# Patient Record
Sex: Male | Born: 1995 | Race: Black or African American | Hispanic: No | Marital: Single | Smoking: Never smoker
Health system: Southern US, Community
[De-identification: ages and names within clinical notes are randomized; demographics above are authoritative.]

---

## 2020-01-08 ENCOUNTER — Emergency Department (HOSPITAL_COMMUNITY)
Admission: EM | Admit: 2020-01-08 | Discharge: 2020-01-08 | Disposition: A | Discharge status: Home / Self Care | Payer: Self-pay | Attending: Emergency Medicine | Admitting: Emergency Medicine

## 2020-01-08 ENCOUNTER — Encounter (HOSPITAL_COMMUNITY): Payer: Self-pay | Admitting: Emergency Medicine

## 2020-01-08 ENCOUNTER — Other Ambulatory Visit: Payer: Self-pay

## 2020-01-08 ENCOUNTER — Ambulatory Visit (HOSPITAL_COMMUNITY): Admission: EM | Admit: 2020-01-08 | Discharge: 2020-01-08 | Discharge status: Against Medical Advice | Payer: Self-pay

## 2020-01-08 ENCOUNTER — Emergency Department (HOSPITAL_COMMUNITY): Payer: Self-pay

## 2020-01-08 DIAGNOSIS — Z5321 Procedure and treatment not carried out due to patient leaving prior to being seen by health care provider: Secondary | ICD-10-CM | POA: Insufficient documentation

## 2020-01-08 DIAGNOSIS — K6289 Other specified diseases of anus and rectum: Secondary | ICD-10-CM | POA: Insufficient documentation

## 2020-01-08 DIAGNOSIS — M5440 Lumbago with sciatica, unspecified side: Secondary | ICD-10-CM | POA: Insufficient documentation

## 2020-01-08 MED ORDER — METHOCARBAMOL 500 MG PO TABS: 500.0000 mg | ORAL_TABLET | Freq: Three times a day (TID) | ORAL | 0 refills | Status: AC

## 2020-01-08 MED ORDER — PREDNISONE 10 MG PO TABS: 40.0000 mg | ORAL_TABLET | Freq: Every day | ORAL | 0 refills | Status: AC

## 2020-01-08 MED ORDER — HYDROCODONE-ACETAMINOPHEN 5-325 MG PO TABS
2.0000 | ORAL_TABLET | Freq: Once | ORAL | Status: AC
  Administered 2020-01-08: 2 via ORAL
  Filled 2020-01-08: qty 2

## 2020-01-08 MED ORDER — KETOROLAC TROMETHAMINE 15 MG/ML IJ SOLN
15.0000 mg | Freq: Once | INTRAMUSCULAR | Status: AC
  Administered 2020-01-08: 15 mg via INTRAMUSCULAR
  Filled 2020-01-08: qty 1

## 2020-01-08 MED ORDER — PREDNISONE 20 MG PO TABS
40.0000 mg | ORAL_TABLET | Freq: Once | ORAL | Status: AC
  Administered 2020-01-08: 40 mg via ORAL
  Filled 2020-01-08: qty 2

## 2020-01-08 NOTE — ED Provider Notes (Addendum)
[facility] EMERGENCY DEPARTMENT Provider Note   CSN: [id number] Arrival date & time: 01/08/20  0160     History Chief Complaint  Patient presents with  . Rectal Pain    Charles Edwards is a 24 y.o. male otherwise healthy no daily medication use presents today for back pain and rectal pain.  Patient reports that he was lifting boxes at his job on Monday, 01/05/2020.  He reports that during that time he developed lower back pain he describes as severe constant aching sharp throb that radiates down to his rectum, no clear alleviating factors gradually improving over the last few days, pain worsened with movement and palpation.  He reports that his lower back pain has gradually resolved but pain around his rectum has persisted.  He denies any fever/chills, IV drug use, injury/trauma, penetrations, drainage/swelling, hematochezia, change to bowel habits, bowel/bladder incontinence, urinary retention, saddle area paresthesias, numbness/tingling, weakness, abdominal pain, testicular pain/swelling, dysuria/hematuria or any additional concerns.  HPI     No past medical history on file.  There are no problems to display for this patient.   No past surgical history on file.     No family history on file.  Social History   Tobacco Use  . Smoking status: Never Smoker  . Smokeless tobacco: Never Used  Substance Use Topics  . Alcohol use: Never  . Drug use: Yes    Types: Marijuana    Home Medications Prior to Admission medications   Medication Sig Start Date End Date Taking? Authorizing Provider  methocarbamol (ROBAXIN) 500 MG tablet Take 1 tablet (500 mg total) by mouth 3 (three) times daily. 01/08/20   Harlene Salts A, PA-C  predniSONE (DELTASONE) 10 MG tablet Take 4 tablets (40 mg total) by mouth daily for 4 days. 01/09/20 01/13/20  Bill Salinas, PA-C    Allergies    Patient has no known allergies.  Review of Systems   Review of Systems   Constitutional: Negative.  Negative for chills and fever.  Cardiovascular: Negative.  Negative for chest pain.  Gastrointestinal: Positive for rectal pain. Negative for abdominal pain, anal bleeding, blood in stool, constipation, diarrhea, nausea and vomiting.  Genitourinary: Negative.  Negative for difficulty urinating, discharge, dysuria, frequency, scrotal swelling and testicular pain.  Musculoskeletal: Positive for back pain. Negative for neck pain.  Neurological: Negative for weakness and numbness.       Denies saddle paresthesias Denies bowel/bladder incontinence Denies urinary retention    Physical Exam Updated Vital Signs BP 122/71   Pulse 67   Temp 97.9 F (36.6 C) (Oral)   Resp 14   Ht 6' (1.829 m)   Wt 115.7 kg   SpO2 100%   BMI 34.58 kg/m   Physical Exam Constitutional:      General: He is not in acute distress.    Appearance: Normal appearance. He is well-developed. He is not ill-appearing or diaphoretic.  HENT:     Head: Normocephalic and atraumatic.     Right Ear: External ear normal.     Left Ear: External ear normal.     Nose: Nose normal.  Eyes:     General: Vision grossly intact. Gaze aligned appropriately.     Pupils: Pupils are equal, round, and reactive to light.  Neck:     Trachea: Trachea and phonation normal. No tracheal deviation.  Pulmonary:     Effort: Pulmonary effort is normal. No respiratory distress.  Abdominal:     General: There is no distension.  Palpations: Abdomen is soft.     Tenderness: There is no abdominal tenderness. There is no guarding or rebound.  Genitourinary:    Rectum: No mass, anal fissure, external hemorrhoid or internal hemorrhoid. Normal anal tone.     Comments: Examination chaperoned by Wyline Copas. Musculoskeletal:        General: Normal range of motion.     Cervical back: Normal range of motion.     Comments: No midline C/T/L spinal tenderness to palpation, left greater than right lumbar paraspinal muscle  tenderness, no deformity, crepitus, or step-off noted. No sign of injury to the neck or back.  Skin:    General: Skin is warm and dry.  Neurological:     Mental Status: He is alert.     GCS: GCS eye subscore is 4. GCS verbal subscore is 5. GCS motor subscore is 6.     Comments: Speech is clear and goal oriented, follows commands Major Cranial nerves without deficit, no facial droop Normal strength in upper and lower extremities bilaterally including dorsiflexion and plantar flexion, strong and equal grip strength Sensation normal to light and sharp touch Moves extremities without ataxia, coordination intact DTR 2+ bilateral patella, no clonus of the feet  Psychiatric:        Behavior: Behavior normal.     ED Results / Procedures / Treatments   Labs (all labs ordered are listed, but only abnormal results are displayed) Labs Reviewed - No data to display  EKG None  Radiology DG Lumbar Spine Complete  Result Date: 01/08/2020 CLINICAL DATA:  Low back pain EXAM: LUMBAR SPINE - COMPLETE 4+ VIEW COMPARISON:  None. FINDINGS: Five lumbar type vertebral bodies are well visualized. Vertebral body height is well maintained. No pars defects are seen. No anterolisthesis is noted. No soft tissue abnormality is seen. IMPRESSION: No acute abnormality noted Electronically Signed   By: Alcide Clever M.D.   On: 01/08/2020 12:36    Procedures Procedures (including critical care time)  Medications Ordered in ED Medications  predniSONE (DELTASONE) tablet 40 mg (has no administration in time range)  ketorolac (TORADOL) 15 MG/ML injection 15 mg (has no administration in time range)  HYDROcodone-acetaminophen (NORCO/VICODIN) 5-325 MG per tablet 2 tablet (has no administration in time range)    ED Course  I have reviewed the triage vital signs and the nursing notes.  Pertinent labs & imaging results that were available during my care of the patient were reviewed by me and considered in my medical  decision making (see chart for details).    MDM Rules/Calculators/A&P                     24 year old otherwise healthy male presents today for low back pain and rectal pain onset 3 days ago after lifting a box at work.  He is well-appearing no acute distress.  He denies any cauda equina type symptoms and has no risk factors concerning for spinal epidural abscess or AAA.  Additionally history and consistent with kidney stone or other intra-abdominal pathologies at this time.  Rectal examination is unremarkable, normal rectal tone, no visible or palpable hemorrhoids fissures or masses.  Stool soft and light brown without evidence of blood.  High suspicion for low back pain with sciatica at this time will obtain lumbar spine x-ray and reassess. Discussed plan with Dr. Particia Nearing. - DG Lumbar spine:  IMPRESSION:  No acute abnormality noted  I have ordered reviewed and interpreted this x-ray, agree with radiologist interpretation of  no acute abnormalities. ----- Patient reassessed sitting in a stool in the corner of the room no acute distress talking with his family member.  We will treat with 2 pills Norco here in the ED, family member will drive home.  Additionally will start patient on prednisone burst for suspected sciatica symptoms, he has no history of diabetes or adverse reaction to steroids.  Additionally will give 15 mg Toradol for treatment of pain, he denies any history of gastric ulcers or kidney disease.  Finally will prescribe Robaxin 500 mg 3 times daily for suspected muscular etiology of patient's symptoms today, he is aware of muscle relaxer precautions and has no questions.  Work note given.  Referral to orthopedist given for follow-up.  As above suspect patient symptoms secondary to sciatica, no evidence of hemorrhoids, hernia, fissures, kidney stone, cauda equina, AAA, spinal epidural abscess, stone disease or other emergent pathologies at this time.  Patient is agreeable to discharge  with above treatment and has no further questions.  At this time there does not appear to be any evidence of an acute emergency medical condition and the patient appears stable for discharge with appropriate outpatient follow up. Diagnosis was discussed with patient who verbalizes understanding of care plan and is agreeable to discharge. I have discussed return precautions with patient and *family who verbalizes understanding of return precautions. Patient encouraged to follow-up with their PCP and ortho. All questions answered.  Patient's case discussed with Dr. Gilford Raid who agrees with plan to discharge with orthopedic follow-up.  ---- 1:43 PM: At discharge patient running down the hallway laughing with his family member no acute distress.  Normal gait.   Note: Portions of this report may have been transcribed using voice recognition software. Every effort was made to ensure accuracy; however, inadvertent computerized transcription errors may still be present. Final Clinical Impression(s) / ED Diagnoses Final diagnoses:  Acute left-sided low back pain with sciatica, sciatica laterality unspecified    Rx / DC Orders ED Discharge Orders         Ordered    predniSONE (DELTASONE) 10 MG tablet  Daily     01/08/20 1308    methocarbamol (ROBAXIN) 500 MG tablet  3 times daily     01/08/20 1308           Deliah Boston, PA-C 01/08/20 1322    Gari Crown 01/08/20 1349    Isla Pence, MD 01/08/20 1414

## 2020-01-08 NOTE — Discharge Instructions (Signed)
You have been diagnosed today with Low back pain with sciatica.  At this time there does not appear to be the presence of an emergent medical condition, however there is always the potential for conditions to change. Please read and follow the below instructions.  Please return to the Emergency Department immediately for any new or worsening symptoms. Please be sure to follow up with your Primary Care Provider within one week regarding your visit today; please call their office to schedule an appointment even if you are feeling better for a follow-up visit. You may use the muscle relaxer Robaxin as prescribed to help with your symptoms.  Do not drive or operate heavy machinery while taking Robaxin as it will make you drowsy.  Do not drink alcohol or take other sedating medications while taking Robaxin as this will worsen side effects. You have been given an NSAID-containing medication called Toradol today.  Do not take the medications including ibuprofen, Aleve, Advil, naproxen or other NSAID-containing medications for the next 2 days.  Please be sure to drink plenty of water over the next few days. You are given a pain medication today called Norco.  This medication will make you drowsy do not drive or perform any dance activities for the rest the day. You may use the medication prednisone as prescribed to help with your symptoms. Please call the orthopedic specialist Dr. Everardo Pacific on your discharge paperwork for follow-up appointment.  Get help right away if: You cannot control when you pee (urinate) or poop (have a bowel movement). You have weakness in any of these areas and it gets worse: Lower back. The area between your hip bones. Butt. Legs. You have redness or swelling of your back. You have a burning feeling when you pee. You have rectal bleeding or swelling You have any new/concerning or worsening of symptoms  Please read the additional information packets attached to your discharge  summary.  Do not take your medicine if  develop an itchy rash, swelling in your mouth or lips, or difficulty breathing; call 911 and seek immediate emergency medical attention if this occurs.  Note: Portions of this text may have been transcribed using voice recognition software. Every effort was made to ensure accuracy; however, inadvertent computerized transcription errors may still be present.

## 2020-01-08 NOTE — ED Triage Notes (Signed)
Patient states lifting boxes at work and the next day his rectum starting hurting.

## 2020-01-08 NOTE — ED Triage Notes (Signed)
Pt here with rectum pain that started hurting yesterday. Pt was seen at [facility] this morning around 440 for the same thing. Pt states he doesn't remember what they told him. Pain is a 9/10. Pt denies ever having a hemorrhoid.

## 2020-01-12 ENCOUNTER — Emergency Department (HOSPITAL_COMMUNITY): Payer: Self-pay

## 2020-01-12 ENCOUNTER — Emergency Department (HOSPITAL_COMMUNITY)
Admission: EM | Admit: 2020-01-12 | Discharge: 2020-01-12 | Disposition: A | Discharge status: Home / Self Care | Payer: Self-pay | Attending: Emergency Medicine | Admitting: Emergency Medicine

## 2020-01-12 ENCOUNTER — Encounter (HOSPITAL_COMMUNITY): Payer: Self-pay | Admitting: *Deleted

## 2020-01-12 ENCOUNTER — Other Ambulatory Visit: Payer: Self-pay

## 2020-01-12 DIAGNOSIS — K610 Anal abscess: Secondary | ICD-10-CM | POA: Insufficient documentation

## 2020-01-12 DIAGNOSIS — M899 Disorder of bone, unspecified: Secondary | ICD-10-CM | POA: Insufficient documentation

## 2020-01-12 LAB — I-STAT CHEM 8, ED
BUN: 11 mg/dL (ref 6–20)
Calcium, Ion: 1.13 mmol/L — ABNORMAL LOW (ref 1.15–1.40)
Chloride: 100 mmol/L (ref 98–111)
Creatinine, Ser: 1.1 mg/dL (ref 0.61–1.24)
Glucose, Bld: 95 mg/dL (ref 70–99)
HCT: 48 % (ref 39.0–52.0)
Hemoglobin: 16.3 g/dL (ref 13.0–17.0)
Potassium: 4 mmol/L (ref 3.5–5.1)
Sodium: 140 mmol/L (ref 135–145)
TCO2: 34 mmol/L — ABNORMAL HIGH (ref 22–32)

## 2020-01-12 MED ORDER — HYDROMORPHONE HCL 1 MG/ML IJ SOLN
1.0000 mg | Freq: Once | INTRAMUSCULAR | Status: AC
  Administered 2020-01-12: 1 mg via INTRAVENOUS
  Filled 2020-01-12: qty 1

## 2020-01-12 MED ORDER — IOHEXOL 300 MG/ML  SOLN
100.0000 mL | Freq: Once | INTRAMUSCULAR | Status: AC | PRN
  Administered 2020-01-12: 12:00:00 100 mL via INTRAVENOUS

## 2020-01-12 MED ORDER — HYDROMORPHONE HCL 1 MG/ML IJ SOLN
1.0000 mg | Freq: Once | INTRAMUSCULAR | Status: AC
  Administered 2020-01-12: 13:00:00 1 mg via INTRAVENOUS
  Filled 2020-01-12: qty 1

## 2020-01-12 MED ORDER — AMOXICILLIN-POT CLAVULANATE 875-125 MG PO TABS: 1.0000 | ORAL_TABLET | Freq: Two times a day (BID) | ORAL | 0 refills | Status: AC

## 2020-01-12 MED ORDER — LIDOCAINE-EPINEPHRINE (PF) 2 %-1:200000 IJ SOLN
10.0000 mL | Freq: Once | INTRAMUSCULAR | Status: AC
  Administered 2020-01-12: 10 mL via INTRADERMAL
  Filled 2020-01-12: qty 20

## 2020-01-12 NOTE — ED Provider Notes (Signed)
[facility] EMERGENCY DEPARTMENT Provider Note   CSN: [id number] Arrival date & time: 01/12/20  0631     History Chief Complaint  Patient presents with  . Abscess  . Back Pain    Kerrington Greenhalgh is a 24 y.o. male.  HPI 24 year old male presents with rectal pain.  Has been ongoing for about 4 or 5 days.  Patient was also endorsing back pain earlier but states he has no back pain now.  No radicular symptoms.  Only has the rectal pain and painful bowel movements.  Family has noticed what appears to be an abscess at his anus.  History reviewed. No pertinent past medical history.  There are no problems to display for this patient.   History reviewed. No pertinent surgical history.     History reviewed. No pertinent family history.  Social History   Tobacco Use  . Smoking status: Never Smoker  . Smokeless tobacco: Never Used  Substance Use Topics  . Alcohol use: Never  . Drug use: Yes    Types: Marijuana    Home Medications Prior to Admission medications   Medication Sig Start Date End Date Taking? Authorizing Provider  amoxicillin-clavulanate (AUGMENTIN) 875-125 MG tablet Take 1 tablet by mouth 2 (two) times daily. One po bid x 7 days 01/12/20   Pricilla Loveless, MD  methocarbamol (ROBAXIN) 500 MG tablet Take 1 tablet (500 mg total) by mouth 3 (three) times daily. 01/08/20   Harlene Salts A, PA-C  predniSONE (DELTASONE) 10 MG tablet Take 4 tablets (40 mg total) by mouth daily for 4 days. 01/09/20 01/13/20  Bill Salinas, PA-C    Allergies    Patient has no known allergies.  Review of Systems   Review of Systems  Gastrointestinal: Positive for rectal pain.  Musculoskeletal: Negative for back pain.  Neurological: Negative for weakness and numbness.  All other systems reviewed and are negative.   Physical Exam Updated Vital Signs BP (!) 117/92   Pulse (!) 56   Temp 98 F (36.7 C) (Oral)   Resp 16   Ht 6' (1.829 m)   Wt 115.7 kg    SpO2 100%   BMI 34.58 kg/m   Physical Exam Vitals and nursing note reviewed.  Constitutional:      Appearance: He is well-developed. He is not ill-appearing or diaphoretic.  HENT:     Head: Normocephalic and atraumatic.     Right Ear: External ear normal.     Left Ear: External ear normal.     Nose: Nose normal.  Eyes:     General:        Right eye: No discharge.        Left eye: No discharge.  Pulmonary:     Effort: Pulmonary effort is normal.  Abdominal:     Palpations: Abdomen is soft.     Tenderness: There is no abdominal tenderness.  Genitourinary:   Musculoskeletal:     Cervical back: Neck supple.     Lumbar back: No tenderness.  Skin:    General: Skin is warm and dry.  Neurological:     Mental Status: He is alert.  Psychiatric:        Mood and Affect: Mood is not anxious.     ED Results / Procedures / Treatments   Labs (all labs ordered are listed, but only abnormal results are displayed) Labs Reviewed  I-STAT CHEM 8, ED - Abnormal; Notable for the following components:      Result Value  Calcium, Ion 1.13 (*)    TCO2 34 (*)    All other components within normal limits    EKG None  Radiology CT PELVIS W CONTRAST  Result Date: 01/12/2020 CLINICAL DATA:  Back pain, rectal pain, painful bump near rectum question abscess EXAM: CT PELVIS WITH CONTRAST TECHNIQUE: Multidetector CT imaging of the pelvis was performed using the standard protocol following the bolus administration of intravenous contrast. Sagittal and coronal MPR images reconstructed from axial data set. CONTRAST:  OMNIPAQUE IOHEXOL 300 MG/ML SOLN IV. No oral contrast. COMPARISON:  None FINDINGS: Urinary Tract:  Visualized ureters and bladder normal appearance Bowel:  Normal appendix.  Large and small bowel loops normal Vascular/Lymphatic: Vascular structures patent. Aortic bifurcation normal caliber. Reproductive:  Unremarkable prostate gland and seminal vesicles Other: Tiny LEFT perianal  fluid collection identified, 2.1 x 1.5 x 2.5 cm consistent with perianal abscess. No other abnormal fluid or gas collections identified. No hernia. No free pelvic fluid or free air. Musculoskeletal: Bone island RIGHT femoral neck. Mixed lytic and sclerotic bone lesion of the intertrochanteric region of the LEFT femur 3.9 x 3.9 cm extending 5.7 cm length. IMPRESSION: Small LEFT perianal abscess measuring 2.1 x 1.5 x 2.5 cm. No acute intrapelvic abnormalities. 3.9 x 3.9 x 5.7 cm diameter mixed lytic and sclerotic bone lesion at the intertrochanteric region of the LEFT femur with generally benign features, question fibro-osseous lesion though somewhat atypical; further evaluation by MR imaging recommended to characterize. Electronically Signed   By: Ulyses Southward M.D.   On: 01/12/2020 12:14    Procedures .Marland KitchenIncision and Drainage  Date/Time: 01/12/2020 1:15 PM Performed by: Pricilla Loveless, MD Authorized by: Pricilla Loveless, MD   Consent:    Consent obtained:  Verbal   Consent given by:  Patient Location:    Type:  Abscess   Size:  2x2 cm   Location:  Anogenital   Anogenital location:  Perianal Pre-procedure details:    Skin preparation:  Betadine Anesthesia (see MAR for exact dosages):    Anesthesia method:  Local infiltration   Local anesthetic:  Lidocaine 2% WITH epi Procedure type:    Complexity:  Simple Procedure details:    Incision types:  Single straight   Incision depth:  Dermal   Scalpel blade:  11   Drainage:  Purulent   Drainage amount:  Copious   Wound treatment:  Wound left open Post-procedure details:    Patient tolerance of procedure:  Tolerated with difficulty   (including critical care time)  Medications Ordered in ED Medications  HYDROmorphone (DILAUDID) injection 1 mg (1 mg Intravenous Given 01/12/20 0907)  iohexol (OMNIPAQUE) 300 MG/ML solution 100 mL (100 mLs Intravenous Contrast Given 01/12/20 1150)  lidocaine-EPINEPHrine (XYLOCAINE W/EPI) 2 %-1:200000 (PF)  injection 10 mL (10 mLs Intradermal Given by Other 01/12/20 1240)  HYDROmorphone (DILAUDID) injection 1 mg (1 mg Intravenous Given 01/12/20 1237)    ED Course  I have reviewed the triage vital signs and the nursing notes.  Pertinent labs & imaging results that were available during my care of the patient were reviewed by me and considered in my medical decision making (see chart for details).    MDM Rules/Calculators/A&P                      Given how tender he is in limited exam, CT scan obtained to rule out deeper infection/perirectal abscess.  CT scan and labs were personally reviewed and incorporated into medical decision making.  The patient  is noted to have a perianal abscess but nothing deeper.  Thus it was incised and drained as above.  Otherwise, I discussed with him and his significant other at the bedside about the bone lesion seen.  This appears to have benign features according to radiology but will need outpatient MRI.  Refer to PCP and/or Ortho.  Will prescribe antibiotics for the abscess. Final Clinical Impression(s) / ED Diagnoses Final diagnoses:  Perianal abscess  Bone lesion    Rx / DC Orders ED Discharge Orders         Ordered    amoxicillin-clavulanate (AUGMENTIN) 875-125 MG tablet  2 times daily     01/12/20 1312           Sherwood Gambler, MD 01/12/20 1317

## 2020-01-12 NOTE — ED Notes (Signed)
Pt d/c home per MD order. Discharge summary reviewed with pt, pt verbalizes understanding. Pt gf discharge ride home.

## 2020-01-12 NOTE — ED Notes (Signed)
Pt transported to CT ?

## 2020-01-12 NOTE — Discharge Instructions (Signed)
Take the antibiotics as prescribed. Use Sitz baths to help with healing of your abscess.  Follow-up with a general surgeon as sometimes abscesses can recur in this area.  The CT scan shows a nonspecific lesion in your left femur.  You need an outpatient MRI to further evaluate this.  You should follow-up with a primary care physician and/or orthopedist to help facilitate this.

## 2020-01-12 NOTE — ED Triage Notes (Signed)
Pt was here recently for back pain and rectal pain. Reports now having a painful bump near rectum. No acute distress is noted at triage.

## 2021-09-23 IMAGING — CR DG LUMBAR SPINE COMPLETE 4+V
5 series · 5 of 5 positions shown · non-contrast
Comparison: None.

CLINICAL DATA: Low back pain

EXAM:
LUMBAR SPINE - COMPLETE 4+ VIEW

[l-spine ap]
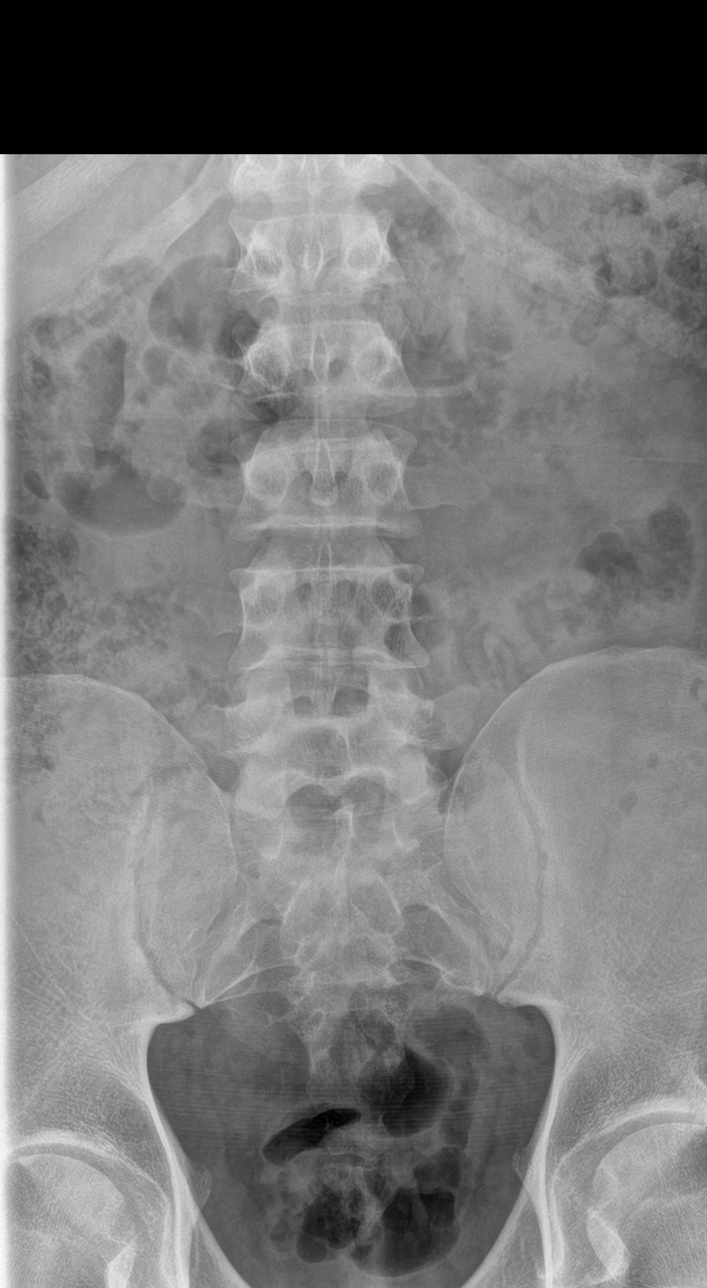

[l-spine obl (1 of 2)]
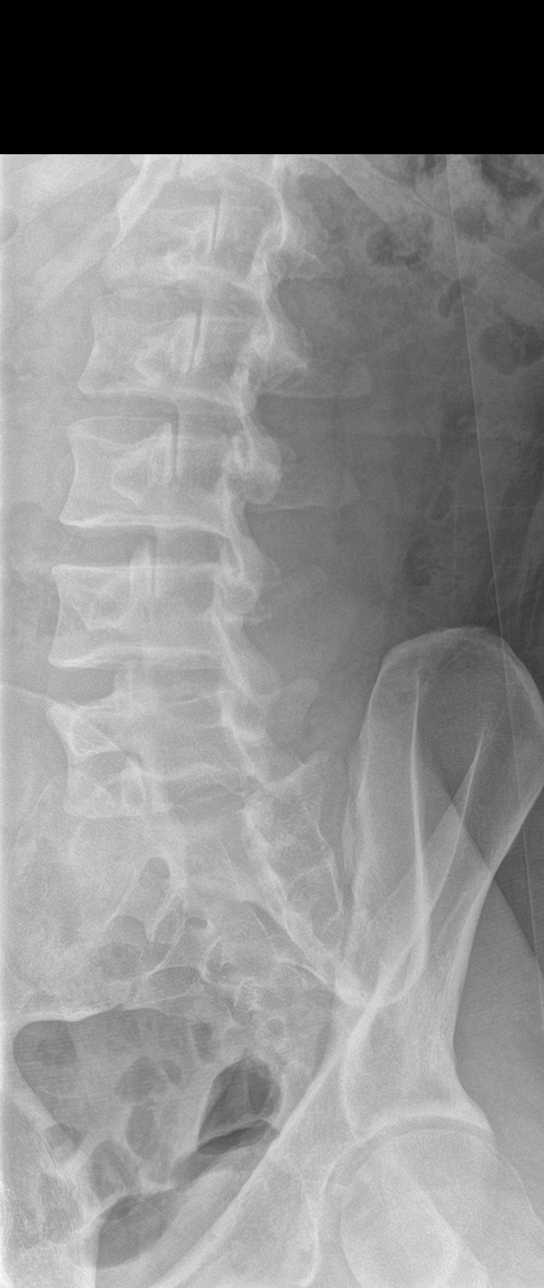

[l-spine obl (2 of 2)]
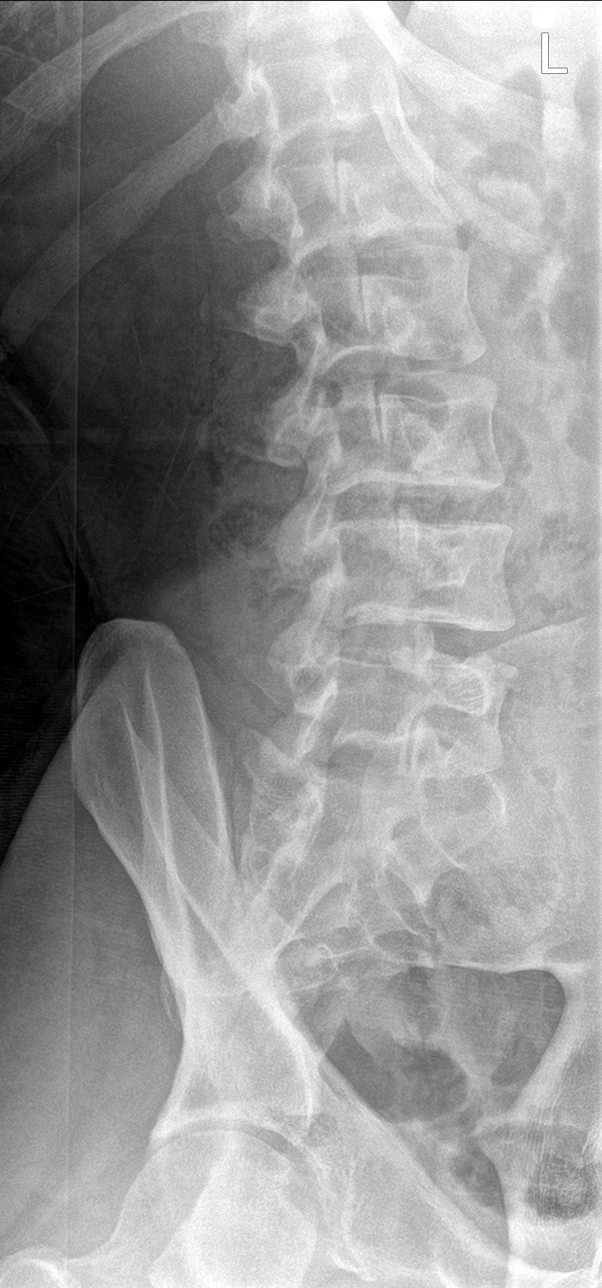

[l-spine lat]
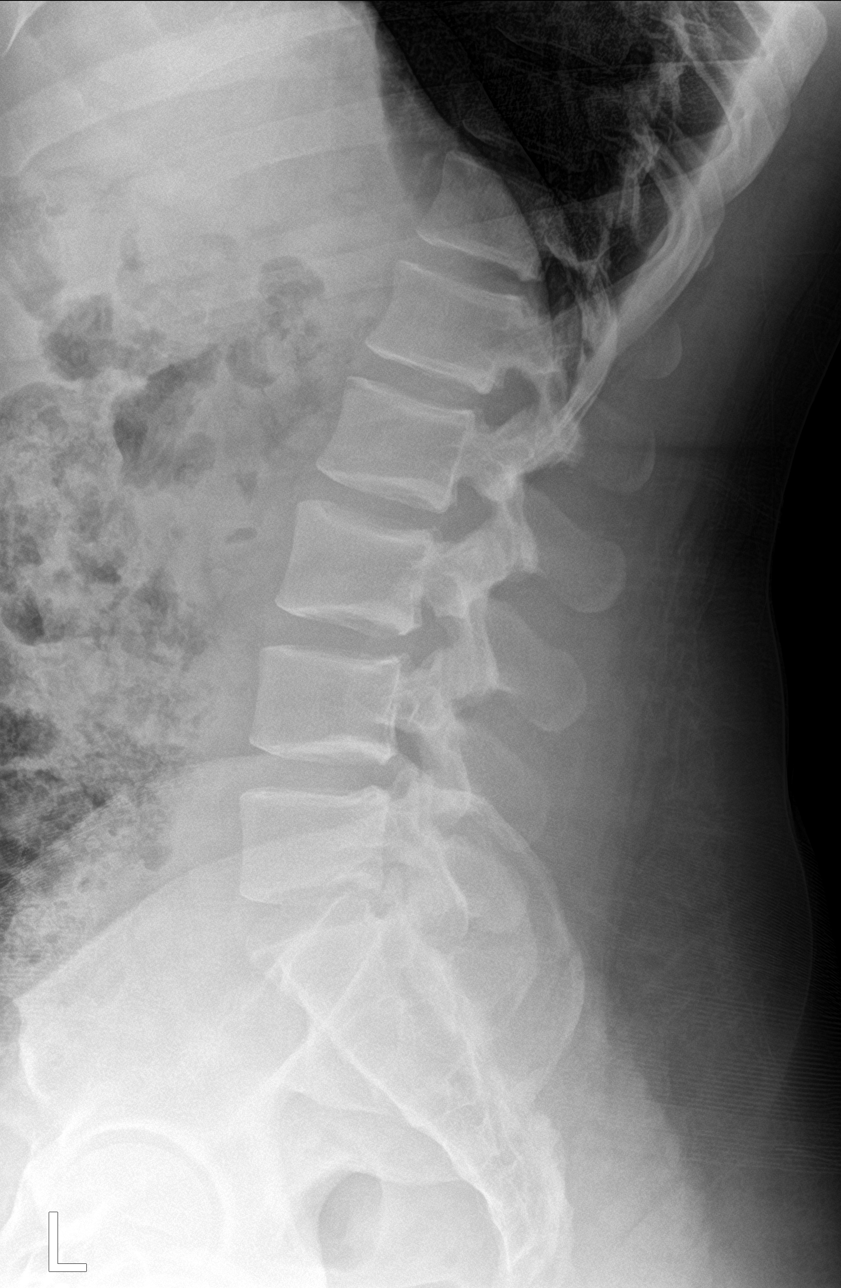

[l-spine spot]
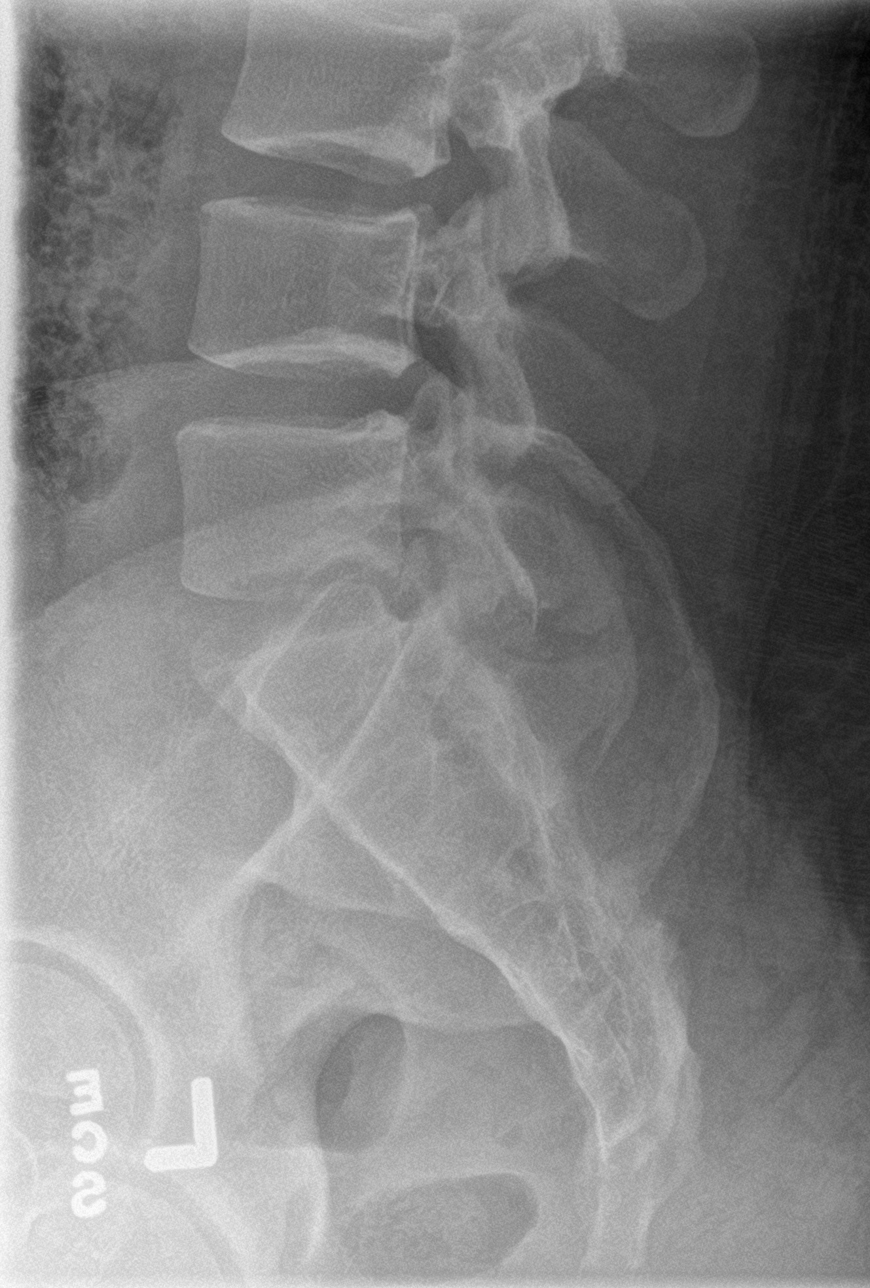

[5 of 5 positions shown; findings below may reference images not displayed]

FINDINGS: Five lumbar type vertebral bodies are well visualized. Vertebral
body height is well maintained. No pars defects are seen. No
anterolisthesis is noted. No soft tissue abnormality is seen.
IMPRESSION: No acute abnormality noted

## 2021-09-27 IMAGING — CT CT PELVIS W/ CM
2 of 3 series · 16 of 46 positions shown, 18 images · IV contrast (APPLIED)
Comparison: None

CLINICAL DATA: Back pain, rectal pain, painful bump near rectum
question abscess

EXAM:
CT PELVIS WITH CONTRAST
TECHNIQUE: Multidetector CT imaging of the pelvis was performed using the
standard protocol following the bolus administration of intravenous
contrast. Sagittal and coronal MPR images reconstructed from axial
data set.
CONTRAST:  100mL OMNIPAQUE IOHEXOL 300 MG/ML SOLN IV. No oral
contrast.

[Series 3: pelvis 5.0 i30f 2 · axial · 0.95mm/px · z∈[+790,+1100]mm · 13 of 72 slices shown, 15 images]
[im 5/72  soft-tissue]
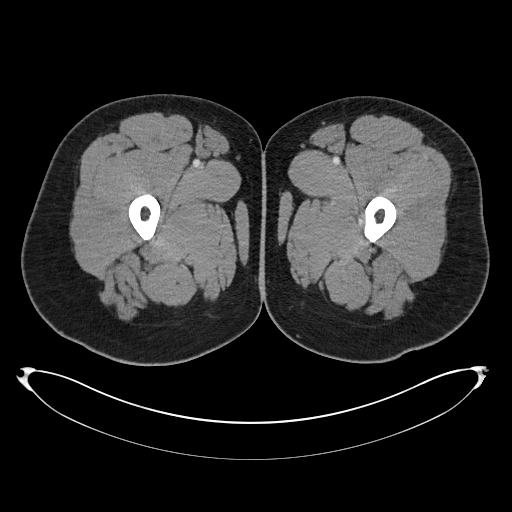
[im 5/72  bone]
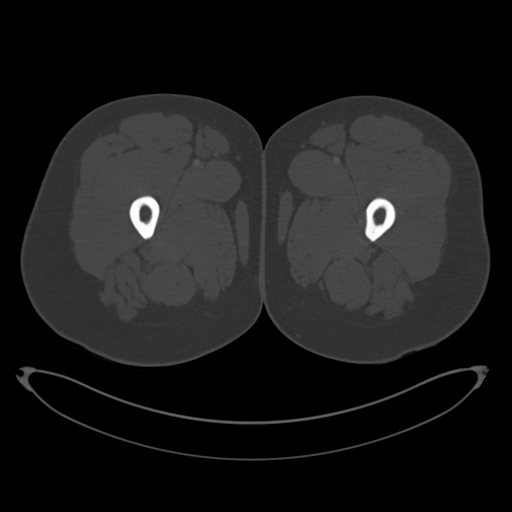
[im 10/72  soft-tissue]
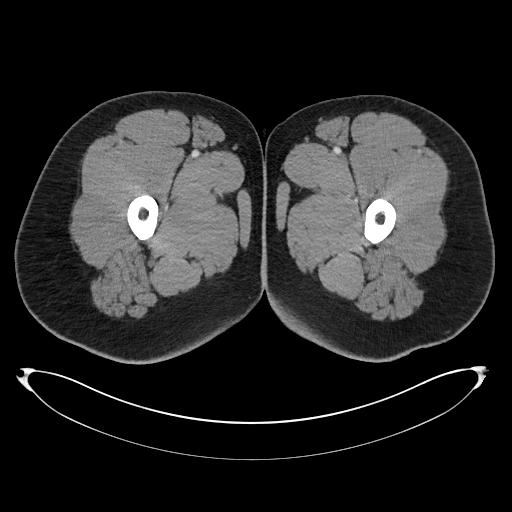
[im 14/72  soft-tissue]
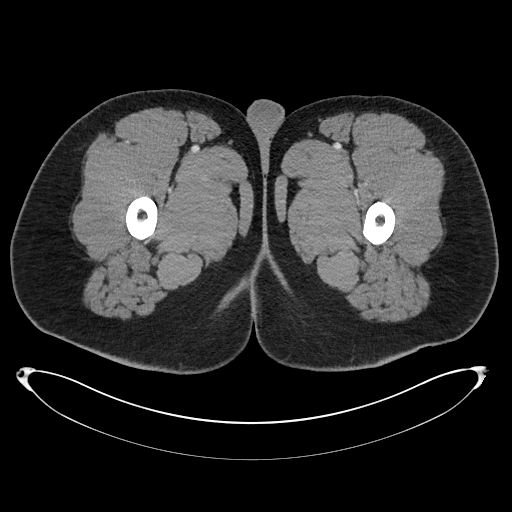
[im 21/72  soft-tissue]
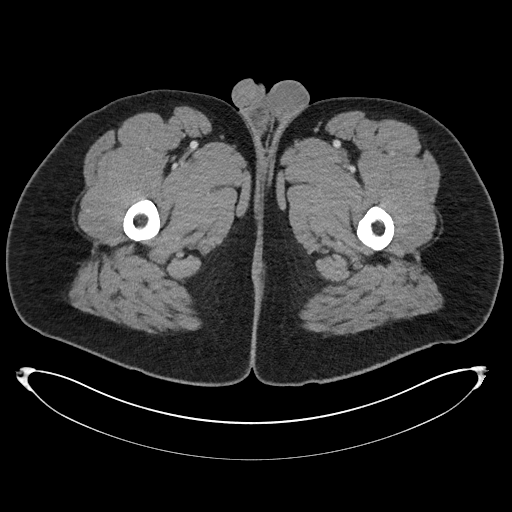
[im 26/72  soft-tissue]
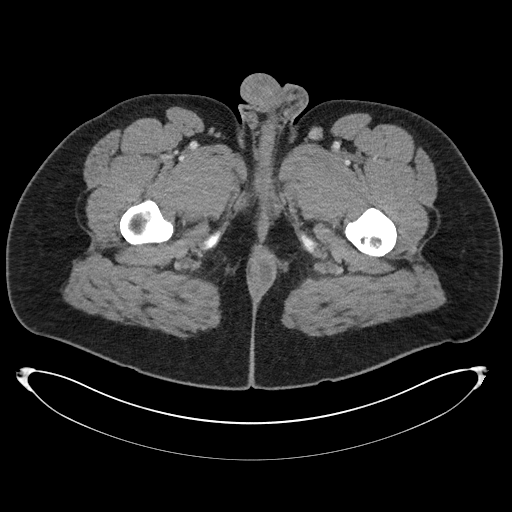
[im 30/72  soft-tissue]
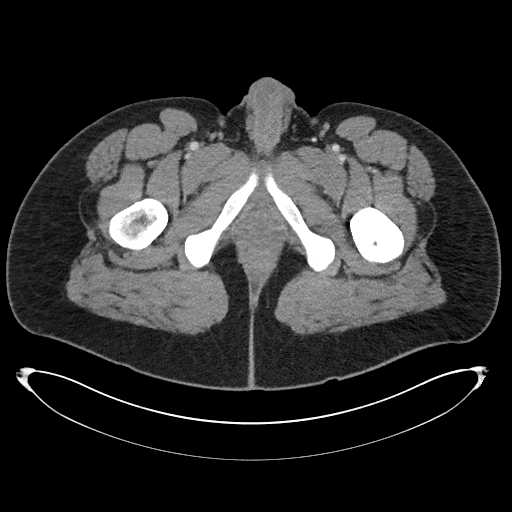
[im 37/72  soft-tissue]
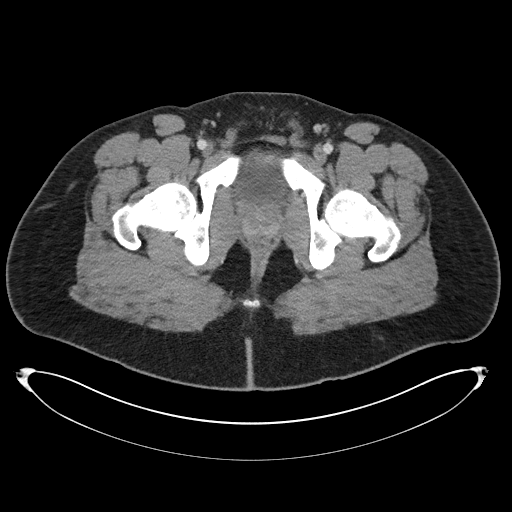
[im 42/72  soft-tissue]
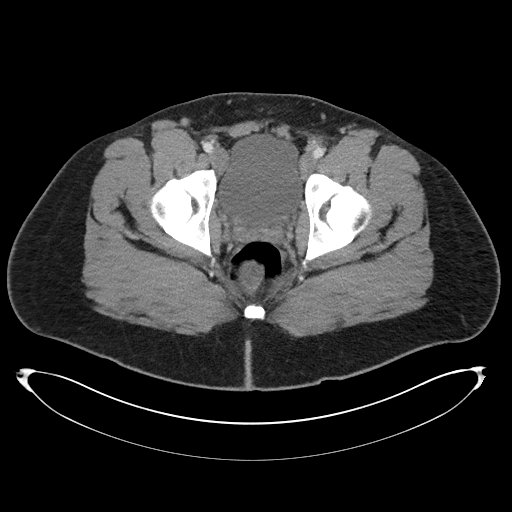
[im 46/72  soft-tissue]
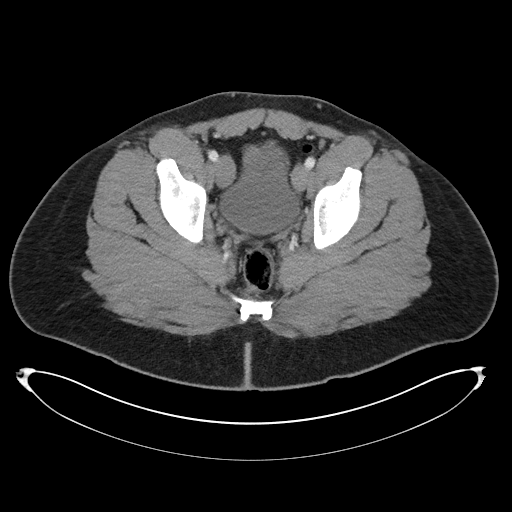
[im 46/72  bone]
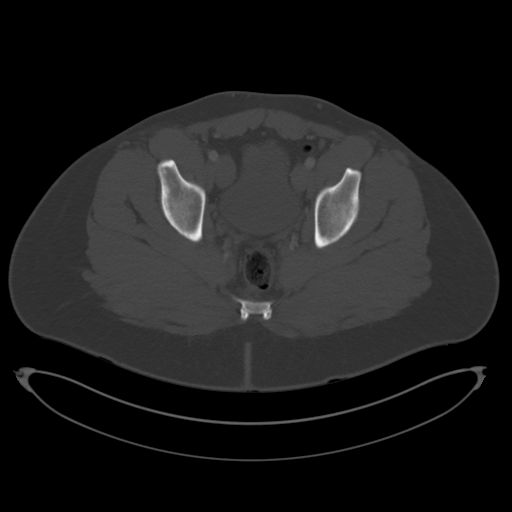
[im 51/72  soft-tissue]
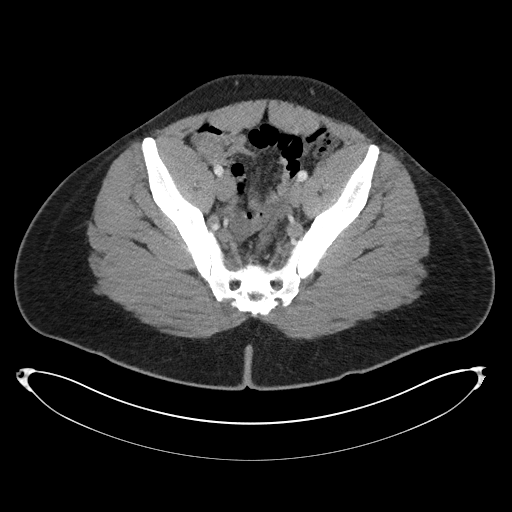
[im 58/72  soft-tissue]
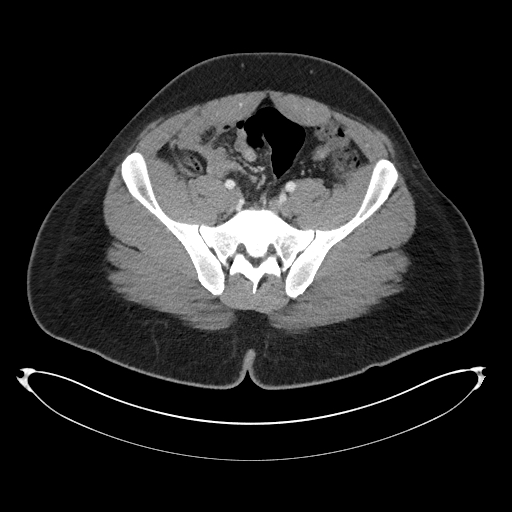
[im 62/72  soft-tissue]
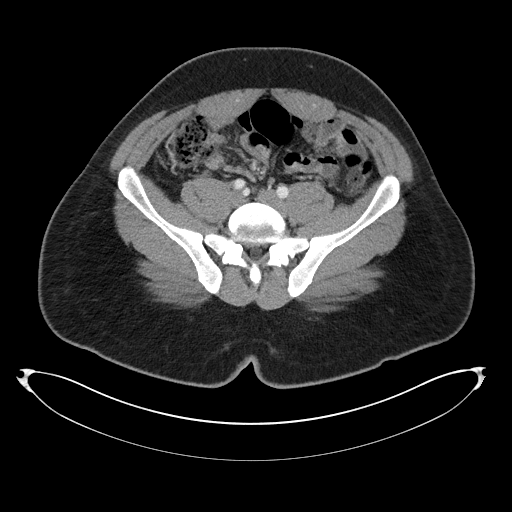
[im 67/72  soft-tissue]
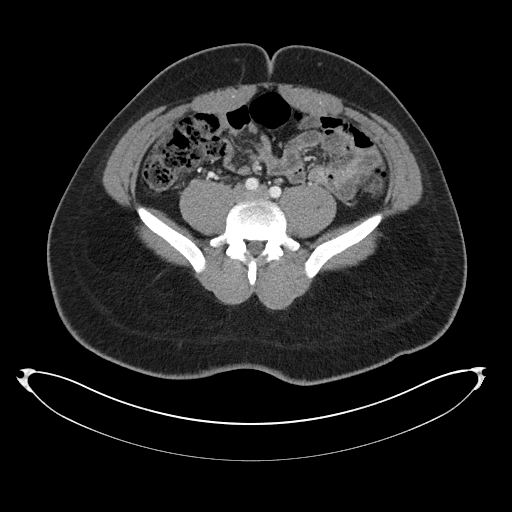

[Series 5: coronal soft tissue · coronal · 0.77mm/px · 3 of 125 slices shown]
[im 56/125  soft-tissue]
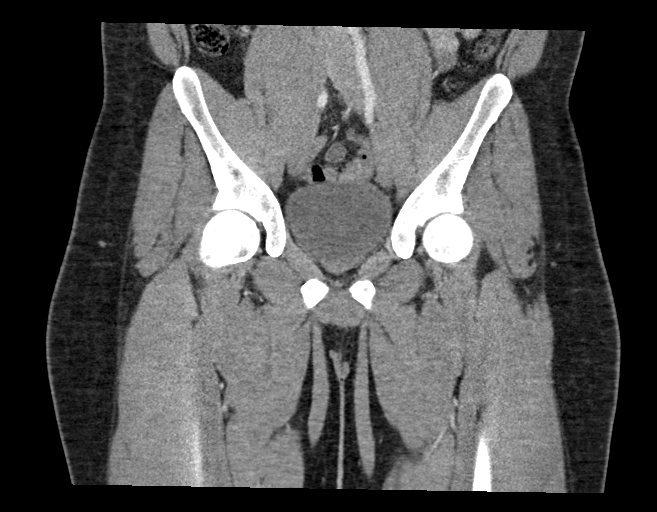
[im 69/125  soft-tissue]
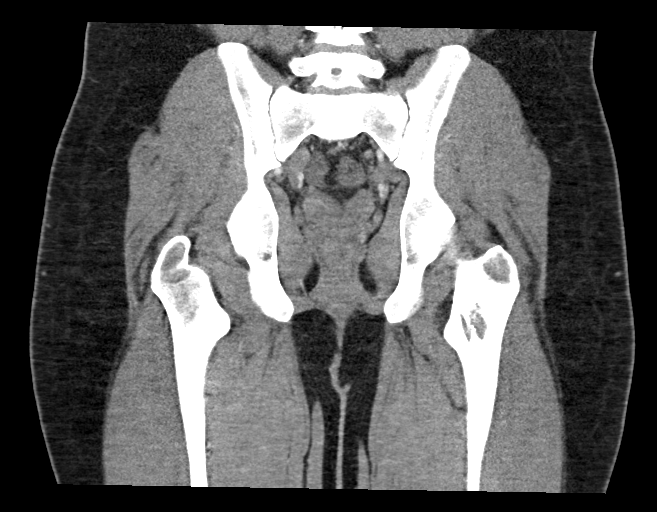
[im 83/125  soft-tissue]
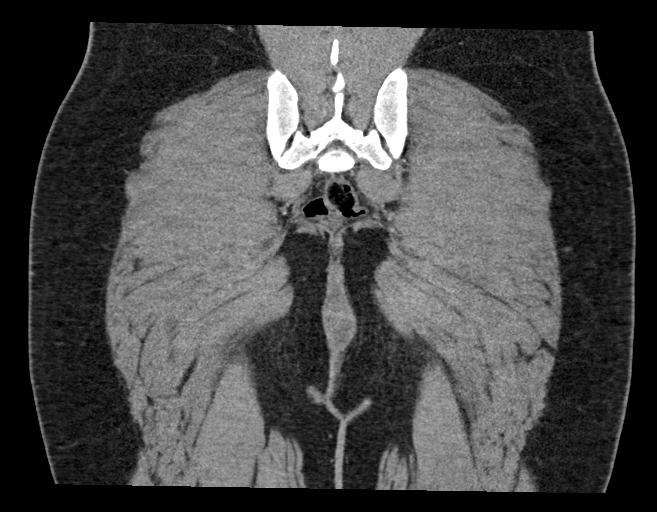

[16 of 46 positions shown; findings below may reference images not displayed]

FINDINGS: Urinary Tract:  Visualized ureters and bladder normal appearance

Bowel:  Normal appendix.  Large and small bowel loops normal

Vascular/Lymphatic: Vascular structures patent. Aortic bifurcation
normal caliber.

Reproductive:  Unremarkable prostate gland and seminal vesicles

Other: Tiny LEFT perianal fluid collection identified, 2.1 x 1.5 x
2.5 cm consistent with perianal abscess. No other abnormal fluid or
gas collections identified. No hernia. No free pelvic fluid or free
air.

Musculoskeletal: Bone island RIGHT femoral neck. Mixed lytic and
sclerotic bone lesion of the intertrochanteric region of the LEFT
femur 3.9 x 3.9 cm extending 5.7 cm length.
IMPRESSION: Small LEFT perianal abscess measuring 2.1 x 1.5 x 2.5 cm.

No acute intrapelvic abnormalities.

3.9 x 3.9 x 5.7 cm diameter mixed lytic and sclerotic bone lesion at
the intertrochanteric region of the LEFT femur with generally benign
features, question fibro-osseous lesion though somewhat atypical;
further evaluation by MR imaging recommended to characterize.
